# Patient Record
Sex: Male | Born: 1980 | Race: White | Hispanic: No | Marital: Single
Health system: Southern US, Community
[De-identification: ages and names within clinical notes are randomized; demographics above are authoritative.]

---

## 2011-05-06 ENCOUNTER — Emergency Department: Payer: Self-pay | Admitting: Emergency Medicine

## 2011-05-06 LAB — CBC
HCT: 41.5 % (ref 40.0–52.0)
HGB: 14.1 g/dL (ref 13.0–18.0)
MCHC: 33.8 g/dL (ref 32.0–36.0)
MCV: 92 fL (ref 80–100)
RBC: 4.5 10*6/uL (ref 4.40–5.90)
WBC: 10.6 10*3/uL (ref 3.8–10.6)

## 2011-05-06 LAB — URINALYSIS, COMPLETE
Ketone: NEGATIVE
Leukocyte Esterase: NEGATIVE
Nitrite: NEGATIVE
Ph: 7 (ref 4.5–8.0)
Protein: NEGATIVE
Specific Gravity: 1.015 (ref 1.003–1.030)
WBC UR: 3 /HPF (ref 0–5)

## 2011-05-06 LAB — DRUG SCREEN, URINE
Barbiturates, Ur Screen: NEGATIVE (ref ?–200)
Benzodiazepine, Ur Scrn: POSITIVE (ref ?–200)
Cannabinoid 50 Ng, Ur ~~LOC~~: NEGATIVE (ref ?–50)
Cocaine Metabolite,Ur ~~LOC~~: NEGATIVE (ref ?–300)
MDMA (Ecstasy)Ur Screen: NEGATIVE (ref ?–500)
Methadone, Ur Screen: NEGATIVE (ref ?–300)
Phencyclidine (PCP) Ur S: NEGATIVE (ref ?–25)

## 2011-05-06 LAB — COMPREHENSIVE METABOLIC PANEL
Albumin: 3.8 g/dL (ref 3.4–5.0)
Alkaline Phosphatase: 78 U/L (ref 50–136)
Anion Gap: 10 (ref 7–16)
BUN: 13 mg/dL (ref 7–18)
Bilirubin,Total: 0.2 mg/dL (ref 0.2–1.0)
Co2: 24 mmol/L (ref 21–32)
Creatinine: 1.09 mg/dL (ref 0.60–1.30)
EGFR (Non-African Amer.): >60
Glucose: 100 mg/dL — ABNORMAL HIGH (ref 65–99)
Osmolality: 283 (ref 275–301)
Potassium: 4 mmol/L (ref 3.5–5.1)
SGPT (ALT): 23 U/L
Sodium: 142 mmol/L (ref 136–145)
Total Protein: 6.6 g/dL (ref 6.4–8.2)

## 2011-05-06 LAB — ETHANOL: Ethanol %: <0.003 % (ref 0.000–0.080)

## 2011-05-06 LAB — TSH: Thyroid Stimulating Horm: 2.6 u[IU]/mL

## 2011-05-06 LAB — SALICYLATE LEVEL: Salicylates, Serum: <1.7 mg/dL

## 2011-06-09 ENCOUNTER — Ambulatory Visit: Payer: Self-pay | Admitting: Neurology

## 2011-07-07 ENCOUNTER — Emergency Department: Payer: Self-pay | Admitting: Emergency Medicine

## 2011-10-04 ENCOUNTER — Ambulatory Visit: Payer: Self-pay | Admitting: Neurology

## 2012-02-09 ENCOUNTER — Emergency Department: Payer: Self-pay | Admitting: Emergency Medicine

## 2012-02-09 LAB — CBC
HCT: 45.3 % (ref 40.0–52.0)
HGB: 15.4 g/dL (ref 13.0–18.0)
MCH: 31.6 pg (ref 26.0–34.0)
MCV: 93 fL (ref 80–100)
Platelet: 270 10*3/uL (ref 150–440)
RBC: 4.86 10*6/uL (ref 4.40–5.90)
RDW: 12.6 % (ref 11.5–14.5)
WBC: 13.1 10*3/uL — ABNORMAL HIGH (ref 3.8–10.6)

## 2012-02-09 LAB — DRUG SCREEN, URINE
Amphetamines, Ur Screen: NEGATIVE (ref ?–1000)
Barbiturates, Ur Screen: NEGATIVE (ref ?–200)
Benzodiazepine, Ur Scrn: NEGATIVE (ref ?–200)
Cannabinoid 50 Ng, Ur ~~LOC~~: NEGATIVE (ref ?–50)
MDMA (Ecstasy)Ur Screen: NEGATIVE (ref ?–500)
Methadone, Ur Screen: NEGATIVE (ref ?–300)
Opiate, Ur Screen: NEGATIVE (ref ?–300)
Phencyclidine (PCP) Ur S: NEGATIVE (ref ?–25)

## 2012-02-09 LAB — COMPREHENSIVE METABOLIC PANEL
Alkaline Phosphatase: 80 U/L (ref 50–136)
Chloride: 107 mmol/L (ref 98–107)
Co2: 27 mmol/L (ref 21–32)
Creatinine: 1.04 mg/dL (ref 0.60–1.30)
EGFR (African American): >60
Glucose: 86 mg/dL (ref 65–99)
Osmolality: 288 (ref 275–301)
Potassium: 3.6 mmol/L (ref 3.5–5.1)
SGOT(AST): 14 U/L — ABNORMAL LOW (ref 15–37)
SGPT (ALT): 21 U/L (ref 12–78)

## 2012-02-09 LAB — URINALYSIS, COMPLETE
Bacteria: NONE SEEN
Bilirubin,UR: NEGATIVE
Glucose,UR: NEGATIVE mg/dL (ref 0–75)
Ketone: NEGATIVE
Leukocyte Esterase: NEGATIVE
Nitrite: NEGATIVE
Ph: 7 (ref 4.5–8.0)
Protein: NEGATIVE
RBC,UR: NONE SEEN /HPF (ref 0–5)
Specific Gravity: 1.004 (ref 1.003–1.030)
WBC UR: <1 /HPF (ref 0–5)

## 2012-02-09 LAB — ETHANOL
Ethanol %: <0.003 % (ref 0.000–0.080)
Ethanol: <3 mg/dL

## 2012-02-09 LAB — VALPROIC ACID LEVEL: Valproic Acid: 106 ug/mL — ABNORMAL HIGH

## 2014-06-16 ENCOUNTER — Emergency Department: Payer: Self-pay | Admitting: Emergency Medicine
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
## 2014-08-11 NOTE — Consult Note (Signed)
Details:    - Psychiatry: PAtient seen. He is calmer today but still prone to lability. Spoke to mother today. Will not change medication orders. Continue monitering.   Electronic Signatures: Audery Amellapacs, John T (MD)  (Signed 20-Oct-13 23:32)  Authored: Details   Last Updated: 20-Oct-13 23:32 by Audery Amellapacs, John T (MD)
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
## 2014-08-11 NOTE — Consult Note (Signed)
Details:    - Psychiatry: PAtient seen. He has been calm and non-violent and non-threatening the whole time in the ER. Today he is awake and alert and his affect is pleasant. He does complain of feeling a little unstable on his feet but his vitals are normal. He no longer meets commitment criteria. We have located a group home willing to take him. His mother, his guardian, is agreeable and will pick him up today. I have ordered a stat depakote level for the unsteadyness and also changed the seroquel dose to only 100mg  at night. I have given a 30 day script for his current meds. I have also put a note ono the chart to give to his mother that I can no longer see him in my office. Will be availible for 30days and gave 3 referral numbers for them to find alternate psychiatrist. Discussed with Dr Luther RedoMolina who agreers to dc plan. DCed the IVC   Electronic Signatures: Ciarra Braddy, Jackquline DenmarkJohn T (MD)  (Signed 23-Oct-13 10:49)  Authored: Details   Last Updated: 23-Oct-13 10:49 by Audery Amellapacs, Nyssa Sayegh T (MD)

## 2014-08-16 NOTE — Consult Note (Signed)
Details:    - Psychiatry: Fayrene FearingJames is calmer today and slept better today. He understands his impulsivity got him in troiuble and denies any suicidal or homicidal ideation. He was stearted on saphris 10mg  qhs and klonopin 0.5mg  tid and had the adderall stopped. He denies SI, denies AH, denies feeling depresssed or hopeless. Reports some nausea from either medication or perhaps something else but denies vomiting. I spoke with his mother and the group home operator who agree with the plan to release him from the ER to go home to the group home. He will follow up with me in the outpt clinic. Discontinued the petition for commitment. Discussed with ER attending. We will call group home to pick him up. Scripts done for saphris and klonopin. Dx bipolar nos, ADHD by hx, mental retardation   Electronic Signatures: Audery Amellapacs, Nyari Olsson T (MD)  (Signed 14-Jan-13 14:42)  Authored: Details   Last Updated: 14-Jan-13 14:42 by Audery Amellapacs, Leandra Vanderweele T (MD)

## 2014-08-16 NOTE — Consult Note (Signed)
Details:    - Psychiatry: PAtient known to me from outpt clinic. Group home manager called to ask me to consider starting him on meds specifically for anxiety. Will add klonopin 0.5mg  tid and reevaluate in person tomorrow.   Electronic Signatures: Audery Amellapacs, Nailea Whitehorn T (MD)  (Signed 13-Jan-13 16:39)  Authored: Details   Last Updated: 13-Jan-13 16:39 by Audery Amellapacs, Hlee Fringer T (MD)

## 2016-06-04 IMAGING — CR DG SHUNT SERIES
1 series · 6 of 6 positions shown · non-contrast
Comparison: None.

CLINICAL DATA: Nausea and vomiting with chest pain, recent increase
in headaches

EXAM:
SHUNT SERIES (AP & LAT SKULL, AP NECK, AP CHEST, AP ABDOMEN)

[Series 1: view not recorded · 0.14mm/px · 6 of 6 slices shown]
[im 1/6]
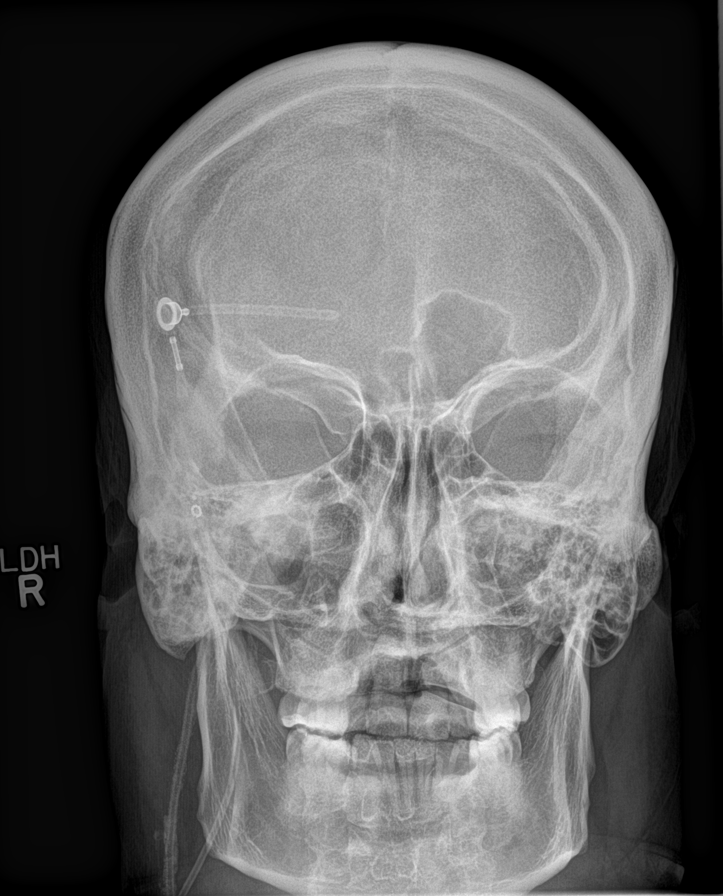
[im 2/6]
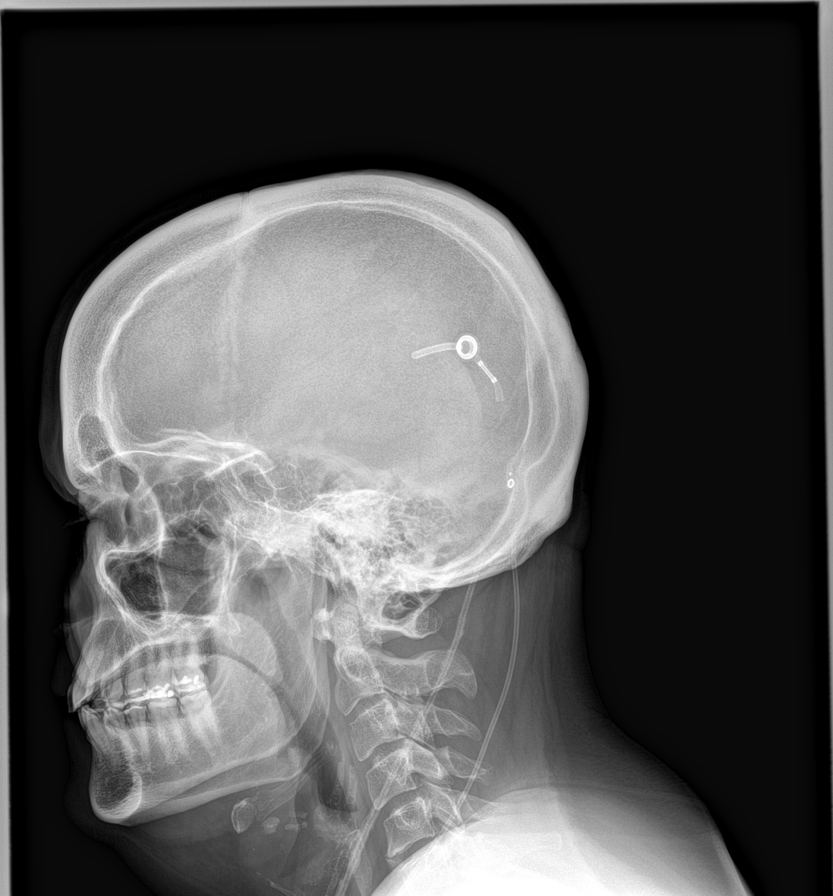
[im 3/6]
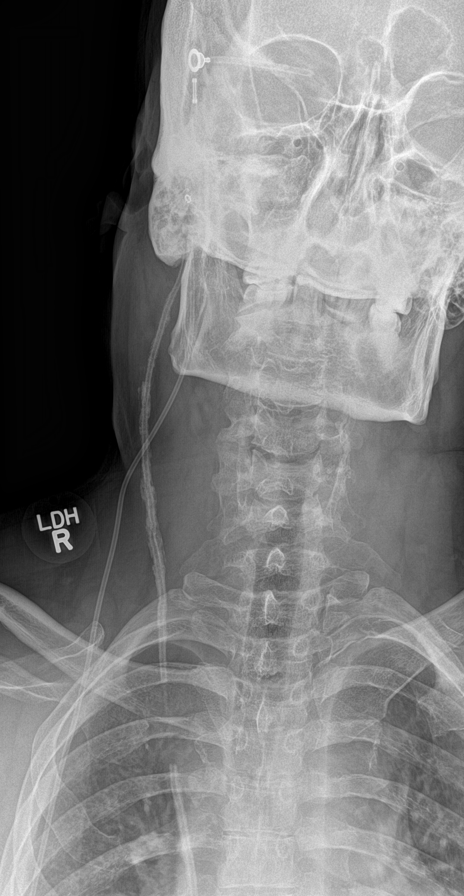
[im 4/6]
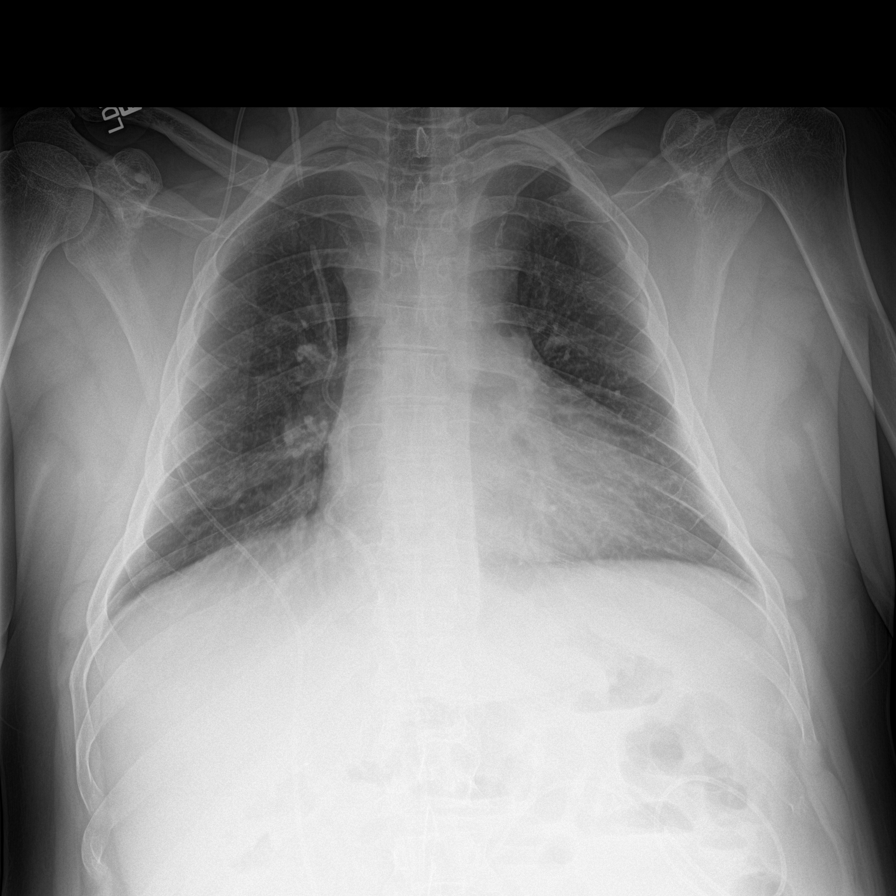
[im 5/6]
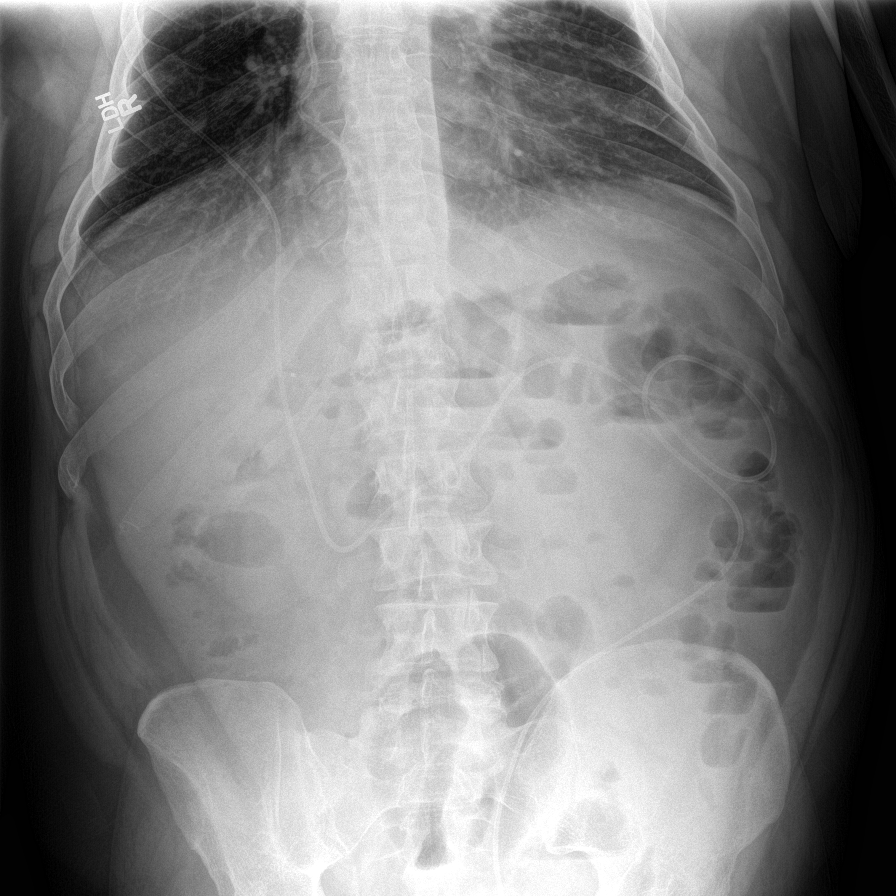
[im 6/6]
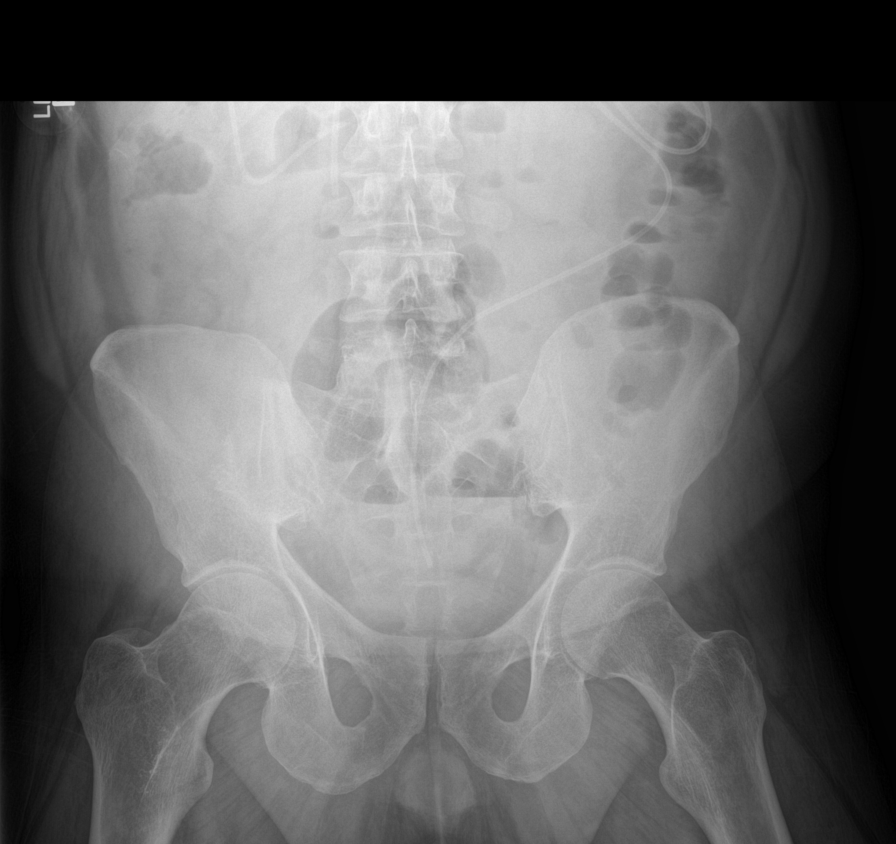

[6 of 6 positions shown; findings below may reference images not displayed]

FINDINGS: Shunt series was obtained and reveals the ventriculostomy catheter
to be within normal limits. The shunt catheter is seen extending
inferiorly into the right neck and subsequently along the right
chest wall into the abdomen. The catheter appears intact. The tip is
noted deep within the pelvis. The lungs are clear. Scattered large
and small bowel gas is noted. Air-fluid levels are noted throughout
the colon of uncertain significance. No acute bony abnormality is
noted. The remnants of an old shunt catheter are noted in the right
neck and chest.
IMPRESSION: Intact shunt catheter.  No other focal abnormality is seen.

## 2016-06-04 IMAGING — CT CT HEAD WITHOUT CONTRAST
1 series · 16 of 30 positions shown, 20 images · non-contrast
Comparison: 06/09/2011

CLINICAL DATA: vomiting last night, pt woke up with a headache
today, pt has a shunt placed in left side of head, pt lives in [REDACTED], pt is able to answer simple questions. Hx seizures and
Hydrocephalus with VP shunt.

EXAM:
CT HEAD WITHOUT CONTRAST
TECHNIQUE: Contiguous axial images were obtained from the base of the skull
through the vertex without intravenous contrast.

[Series 2: head wo · axial · 0.43mm/px · z∈[+435,+579]mm · 16 of 36 slices shown, 20 images]
[im 2/36  brain]
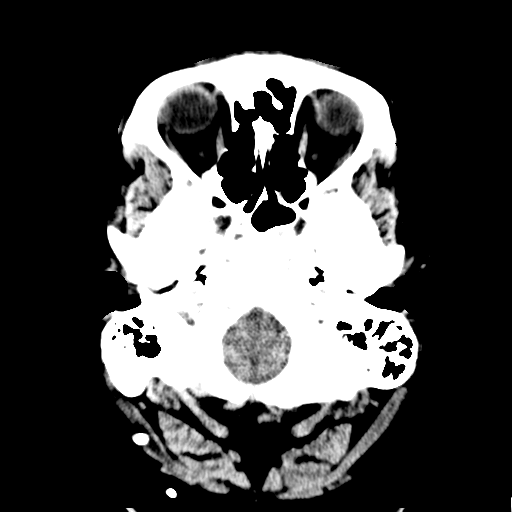
[im 2/36  bone]
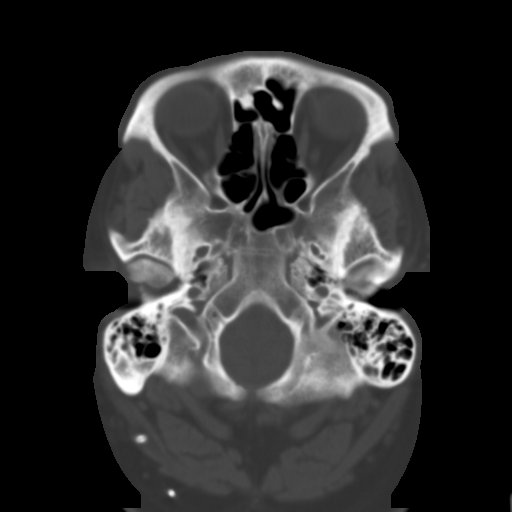
[im 4/36  brain]
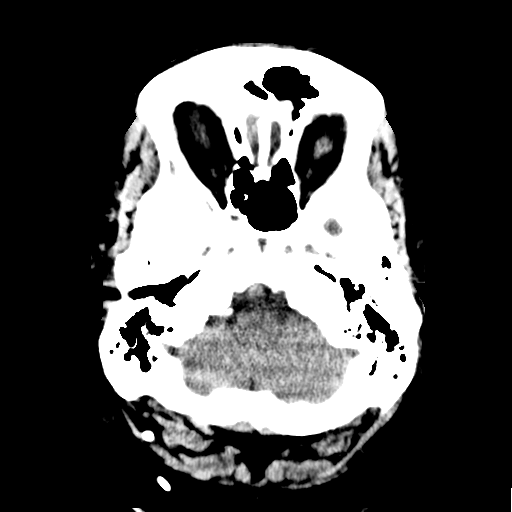
[im 7/36  brain]
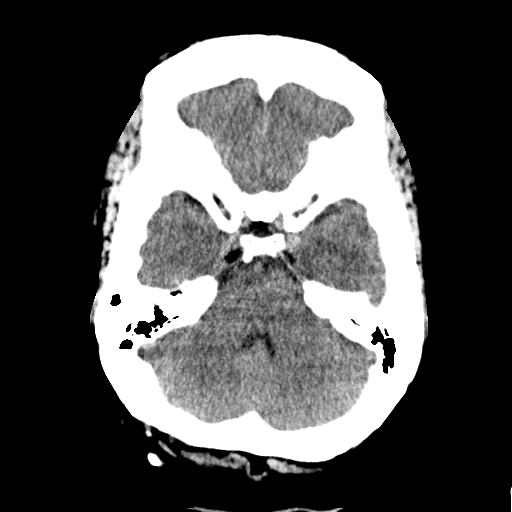
[im 9/36  brain]
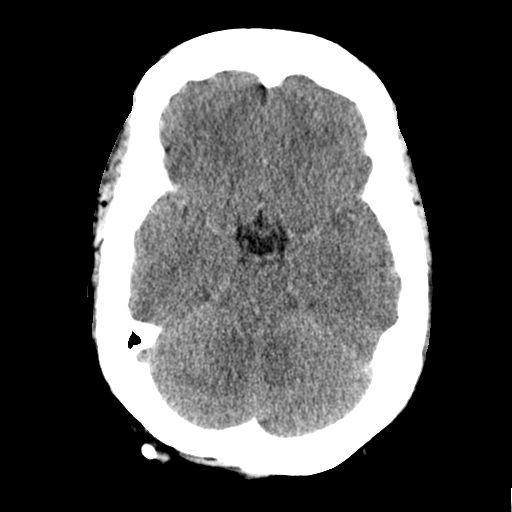
[im 10/36  brain]
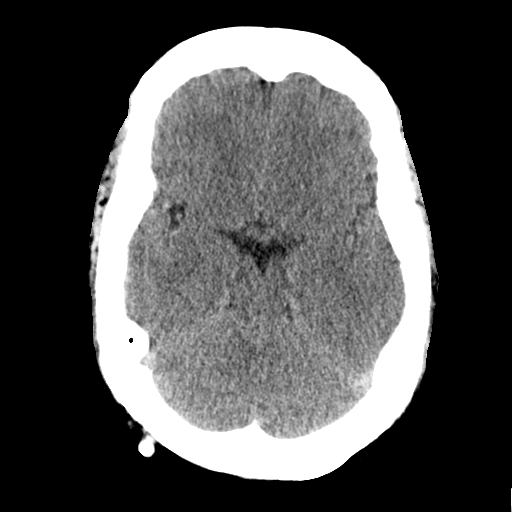
[im 10/36  bone]
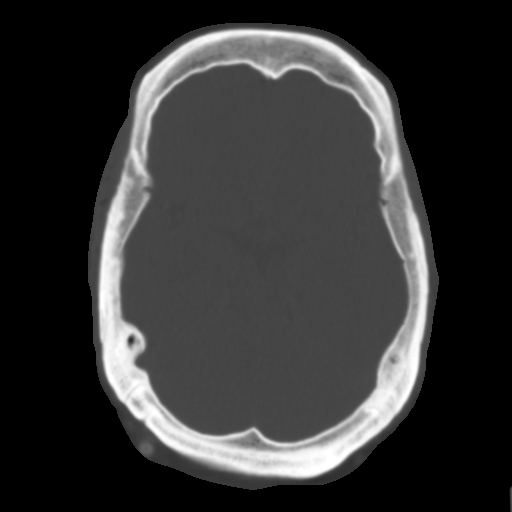
[im 13/36  brain]
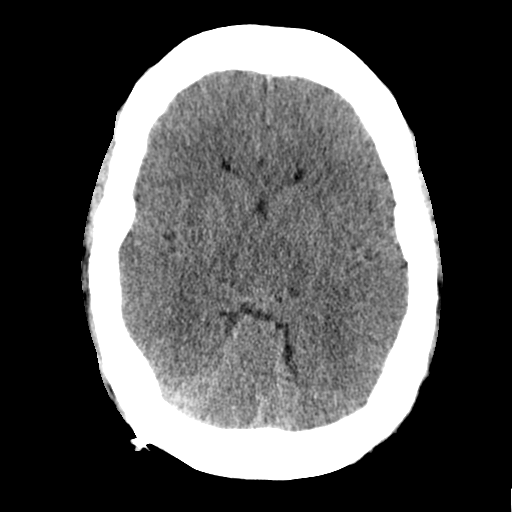
[im 15/36  brain]
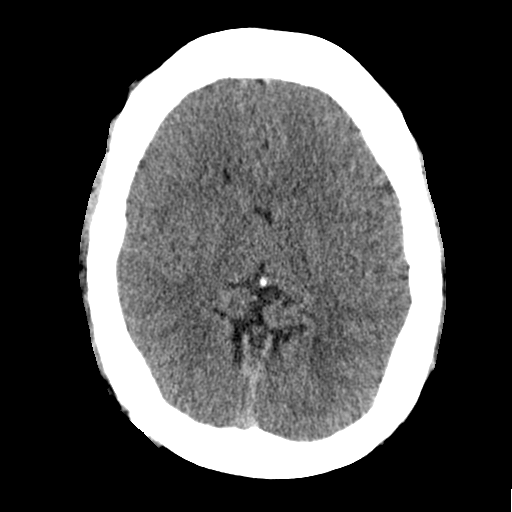
[im 17/36  brain]
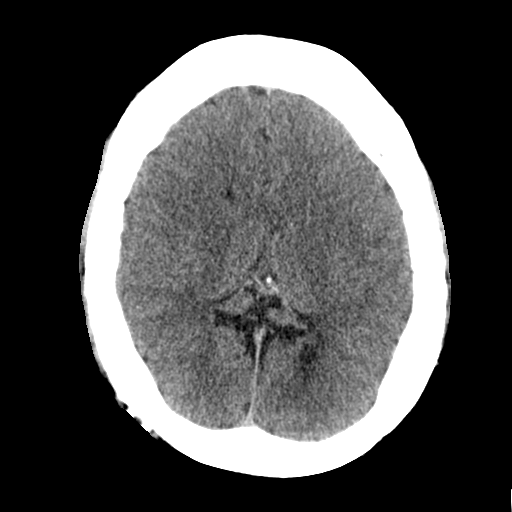
[im 19/36  brain]
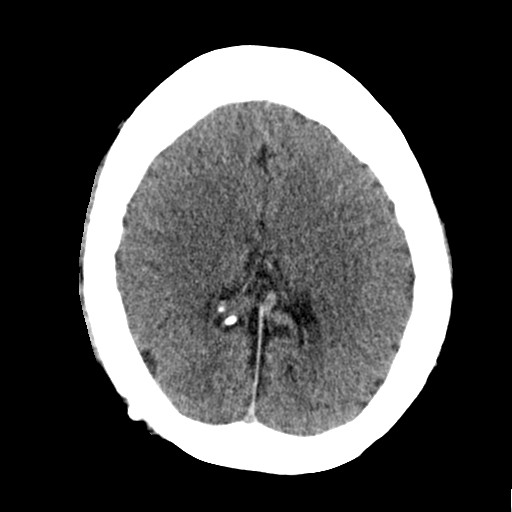
[im 19/36  bone]
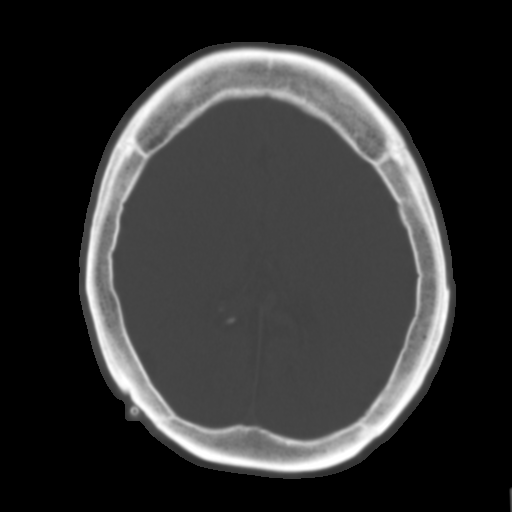
[im 21/36  brain]
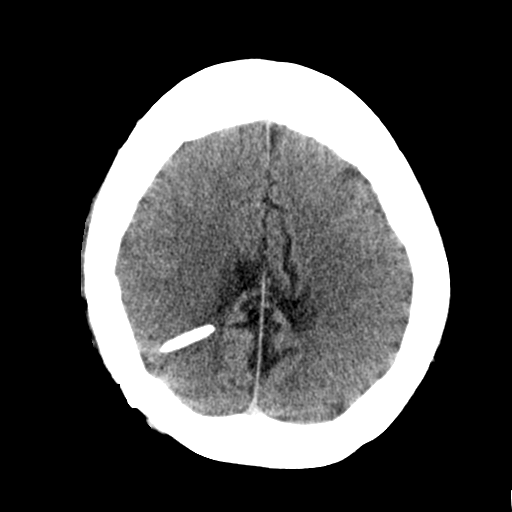
[im 23/36  brain]
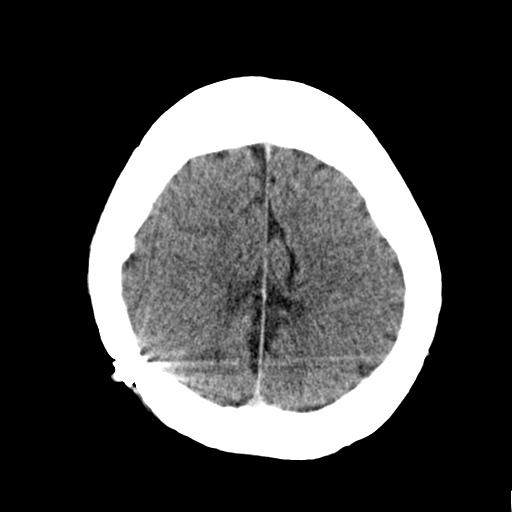
[im 26/36  brain]
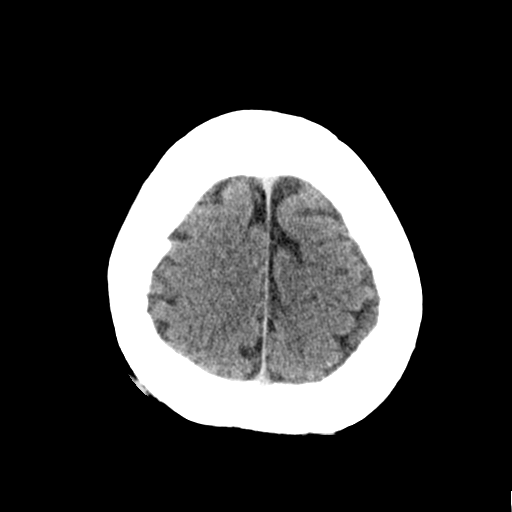
[im 27/36  brain]
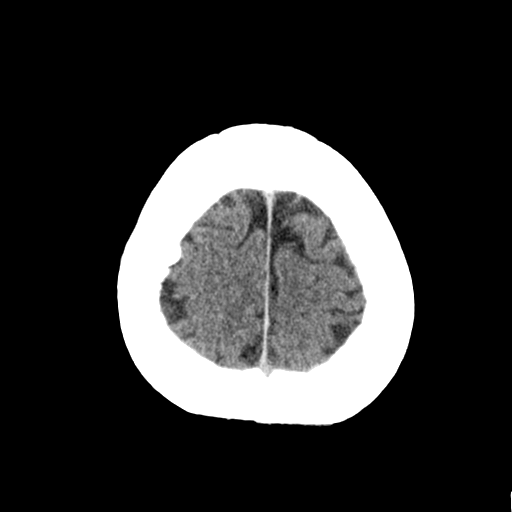
[im 27/36  bone]
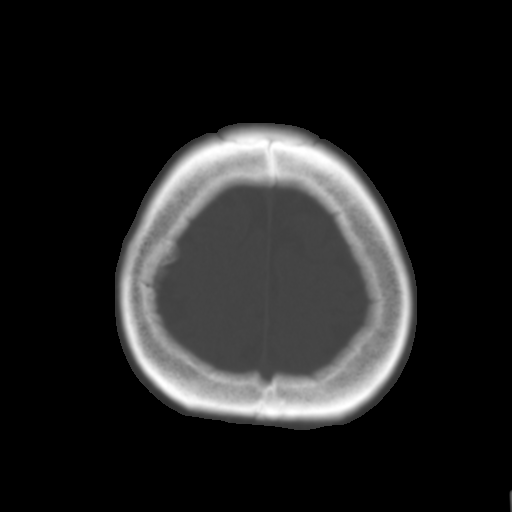
[im 29/36  brain]
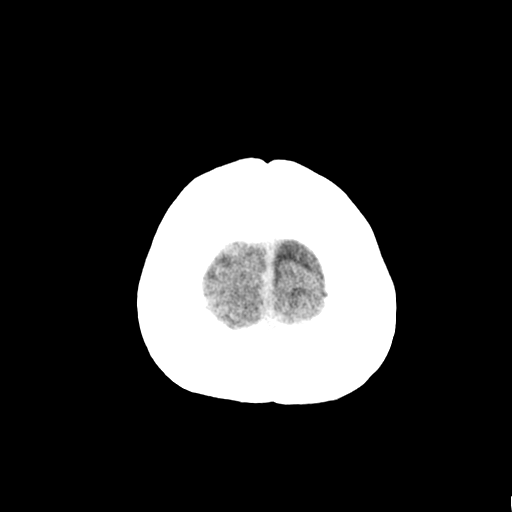
[im 32/36  brain]
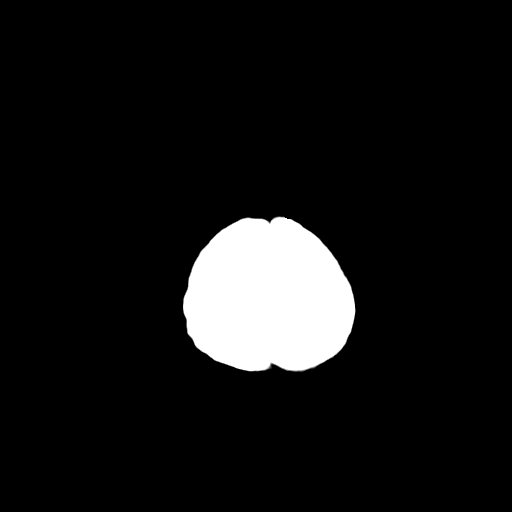
[im 34/36  brain]
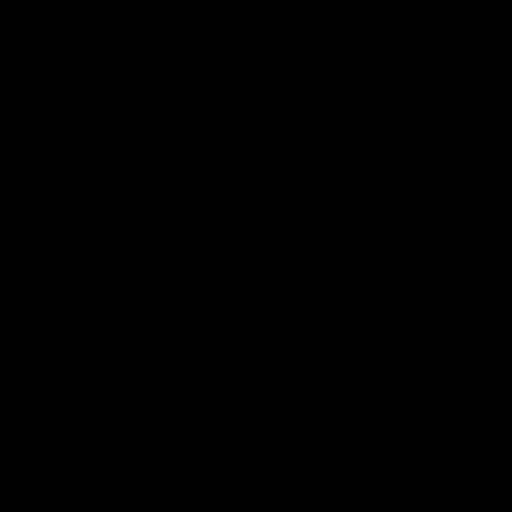

[16 of 30 positions shown; findings below may reference images not displayed]

FINDINGS: VP shunt from right parieto-occipital approach to right occipital
region, stable. No hydrocephalus or ventriculomegaly. No midline
shift. Negative for acute intracranial hemorrhage, mass, or mass
effect. Acute infarct may be inapparent on non contrast CT.
Symmetric ventricles and sulci bilaterally. Bone windows demonstrate
no acute calvarial lesion.
IMPRESSION: 1. Negative for bleed or other acute intracranial process.
2. Stable ventriculostomy catheter with no hydrocephalus or midline
shift.
